# Patient Record
Sex: Female | Born: 1950 | Race: White | Hispanic: No | Marital: Single | Smoking: Never smoker
Health system: Southern US, Community
[De-identification: ages and names within clinical notes are randomized; demographics above are authoritative.]

## PROBLEM LIST (undated history)

## (undated) DIAGNOSIS — E119 Type 2 diabetes mellitus without complications: Secondary | ICD-10-CM

## (undated) DIAGNOSIS — I10 Essential (primary) hypertension: Secondary | ICD-10-CM

## (undated) DIAGNOSIS — E039 Hypothyroidism, unspecified: Secondary | ICD-10-CM

## (undated) DIAGNOSIS — K219 Gastro-esophageal reflux disease without esophagitis: Secondary | ICD-10-CM

## (undated) HISTORY — DX: Gastro-esophageal reflux disease without esophagitis: K21.9

## (undated) HISTORY — PX: MASTECTOMY: SHX3

## (undated) HISTORY — DX: Type 2 diabetes mellitus without complications: E11.9

## (undated) HISTORY — DX: Essential (primary) hypertension: I10

## (undated) HISTORY — DX: Hypothyroidism, unspecified: E03.9

---

## 1993-03-06 HISTORY — PX: TOTAL ABDOMINAL HYSTERECTOMY: SHX209

## 1997-11-24 ENCOUNTER — Encounter: Admission: RE | Admit: 1997-11-24 | Discharge: 1998-02-22 | Payer: Self-pay | Admitting: Internal Medicine

## 1998-04-19 ENCOUNTER — Other Ambulatory Visit: Admission: RE | Admit: 1998-04-19 | Discharge: 1998-04-19 | Payer: Self-pay | Admitting: Obstetrics and Gynecology

## 1998-10-25 ENCOUNTER — Other Ambulatory Visit: Admission: RE | Admit: 1998-10-25 | Discharge: 1998-10-25 | Payer: Self-pay | Admitting: Obstetrics and Gynecology

## 1999-05-11 ENCOUNTER — Other Ambulatory Visit: Admission: RE | Admit: 1999-05-11 | Discharge: 1999-05-11 | Payer: Self-pay | Admitting: Obstetrics and Gynecology

## 2000-05-31 ENCOUNTER — Other Ambulatory Visit: Admission: RE | Admit: 2000-05-31 | Discharge: 2000-05-31 | Payer: Self-pay | Admitting: Obstetrics and Gynecology

## 2001-06-20 ENCOUNTER — Other Ambulatory Visit: Admission: RE | Admit: 2001-06-20 | Discharge: 2001-06-20 | Payer: Self-pay | Admitting: Obstetrics and Gynecology

## 2002-06-25 ENCOUNTER — Other Ambulatory Visit: Admission: RE | Admit: 2002-06-25 | Discharge: 2002-06-25 | Payer: Self-pay | Admitting: Obstetrics and Gynecology

## 2003-01-01 ENCOUNTER — Ambulatory Visit (HOSPITAL_BASED_OUTPATIENT_CLINIC_OR_DEPARTMENT_OTHER): Admission: RE | Admit: 2003-01-01 | Discharge: 2003-01-01 | Payer: Self-pay | Admitting: Orthopedic Surgery

## 2003-01-01 ENCOUNTER — Ambulatory Visit (HOSPITAL_COMMUNITY): Admission: RE | Admit: 2003-01-01 | Discharge: 2003-01-01 | Payer: Self-pay | Admitting: Orthopedic Surgery

## 2003-01-01 ENCOUNTER — Encounter (INDEPENDENT_AMBULATORY_CARE_PROVIDER_SITE_OTHER): Payer: Self-pay | Admitting: *Deleted

## 2003-06-26 ENCOUNTER — Other Ambulatory Visit: Admission: RE | Admit: 2003-06-26 | Discharge: 2003-06-26 | Payer: Self-pay | Admitting: Obstetrics and Gynecology

## 2003-12-30 ENCOUNTER — Ambulatory Visit (HOSPITAL_COMMUNITY): Admission: RE | Admit: 2003-12-30 | Discharge: 2003-12-30 | Payer: Self-pay | Admitting: Gastroenterology

## 2006-05-03 ENCOUNTER — Encounter: Admission: RE | Admit: 2006-05-03 | Discharge: 2006-05-03 | Payer: Self-pay | Admitting: Obstetrics and Gynecology

## 2006-05-03 ENCOUNTER — Encounter (INDEPENDENT_AMBULATORY_CARE_PROVIDER_SITE_OTHER): Payer: Self-pay | Admitting: Diagnostic Radiology

## 2006-05-03 ENCOUNTER — Encounter (INDEPENDENT_AMBULATORY_CARE_PROVIDER_SITE_OTHER): Payer: Self-pay | Admitting: *Deleted

## 2006-05-11 ENCOUNTER — Encounter: Admission: RE | Admit: 2006-05-11 | Discharge: 2006-05-11 | Payer: Self-pay | Admitting: Obstetrics and Gynecology

## 2006-05-28 ENCOUNTER — Encounter: Admission: RE | Admit: 2006-05-28 | Discharge: 2006-05-28 | Payer: Self-pay | Admitting: General Surgery

## 2006-05-28 ENCOUNTER — Ambulatory Visit (HOSPITAL_COMMUNITY): Admission: RE | Admit: 2006-05-28 | Discharge: 2006-05-28 | Payer: Self-pay | Admitting: General Surgery

## 2006-05-29 ENCOUNTER — Encounter: Admission: RE | Admit: 2006-05-29 | Discharge: 2006-05-29 | Payer: Self-pay | Admitting: General Surgery

## 2006-05-29 ENCOUNTER — Ambulatory Visit (HOSPITAL_BASED_OUTPATIENT_CLINIC_OR_DEPARTMENT_OTHER): Admission: RE | Admit: 2006-05-29 | Discharge: 2006-05-29 | Payer: Self-pay | Admitting: General Surgery

## 2006-05-29 ENCOUNTER — Encounter (INDEPENDENT_AMBULATORY_CARE_PROVIDER_SITE_OTHER): Payer: Self-pay | Admitting: Specialist

## 2006-06-04 ENCOUNTER — Ambulatory Visit: Payer: Self-pay | Admitting: Oncology

## 2006-06-14 ENCOUNTER — Ambulatory Visit (HOSPITAL_COMMUNITY): Admission: RE | Admit: 2006-06-14 | Discharge: 2006-06-14 | Payer: Self-pay | Admitting: General Surgery

## 2006-06-14 ENCOUNTER — Encounter (INDEPENDENT_AMBULATORY_CARE_PROVIDER_SITE_OTHER): Payer: Self-pay | Admitting: Specialist

## 2006-06-18 ENCOUNTER — Ambulatory Visit: Admission: RE | Admit: 2006-06-18 | Discharge: 2006-09-16 | Payer: Self-pay | Admitting: *Deleted

## 2006-07-05 ENCOUNTER — Encounter (INDEPENDENT_AMBULATORY_CARE_PROVIDER_SITE_OTHER): Payer: Self-pay | Admitting: Specialist

## 2006-07-06 ENCOUNTER — Inpatient Hospital Stay (HOSPITAL_COMMUNITY): Admission: RE | Admit: 2006-07-06 | Discharge: 2006-07-07 | Payer: Self-pay | Admitting: General Surgery

## 2006-07-17 LAB — CBC WITH DIFFERENTIAL/PLATELET
Basophils Absolute: 0.2 10*3/uL — ABNORMAL HIGH (ref 0.0–0.1)
MCH: 30.2 pg (ref 26.0–34.0)
MCV: 84.3 fL (ref 81.0–101.0)
MONO#: 0.7 10*3/uL (ref 0.1–0.9)
MONO%: 6.6 % (ref 0.0–13.0)
NEUT#: 8.5 10*3/uL — ABNORMAL HIGH (ref 1.5–6.5)
NEUT%: 74.8 % (ref 39.6–76.8)
Platelets: 217 10*3/uL (ref 145–400)
RBC: 4.38 10*6/uL (ref 3.70–5.32)
RDW: 12.3 % (ref 11.3–14.5)
WBC: 11.3 10*3/uL — ABNORMAL HIGH (ref 3.9–10.0)
lymph#: 1.8 10*3/uL (ref 0.9–3.3)

## 2006-07-17 LAB — COMPREHENSIVE METABOLIC PANEL
Alkaline Phosphatase: 77 U/L (ref 39–117)
BUN: 12 mg/dL (ref 6–23)
Calcium: 9.1 mg/dL (ref 8.4–10.5)
Glucose, Bld: 203 mg/dL — ABNORMAL HIGH (ref 70–99)

## 2007-08-15 IMAGING — CR DG CHEST 2V
2 series · 2 of 2 positions shown · non-contrast
Comparison: None.

CLINICAL DATA: Pre op left breast surgery.
 CHEST ? 2 VIEW:

[w chest pa]
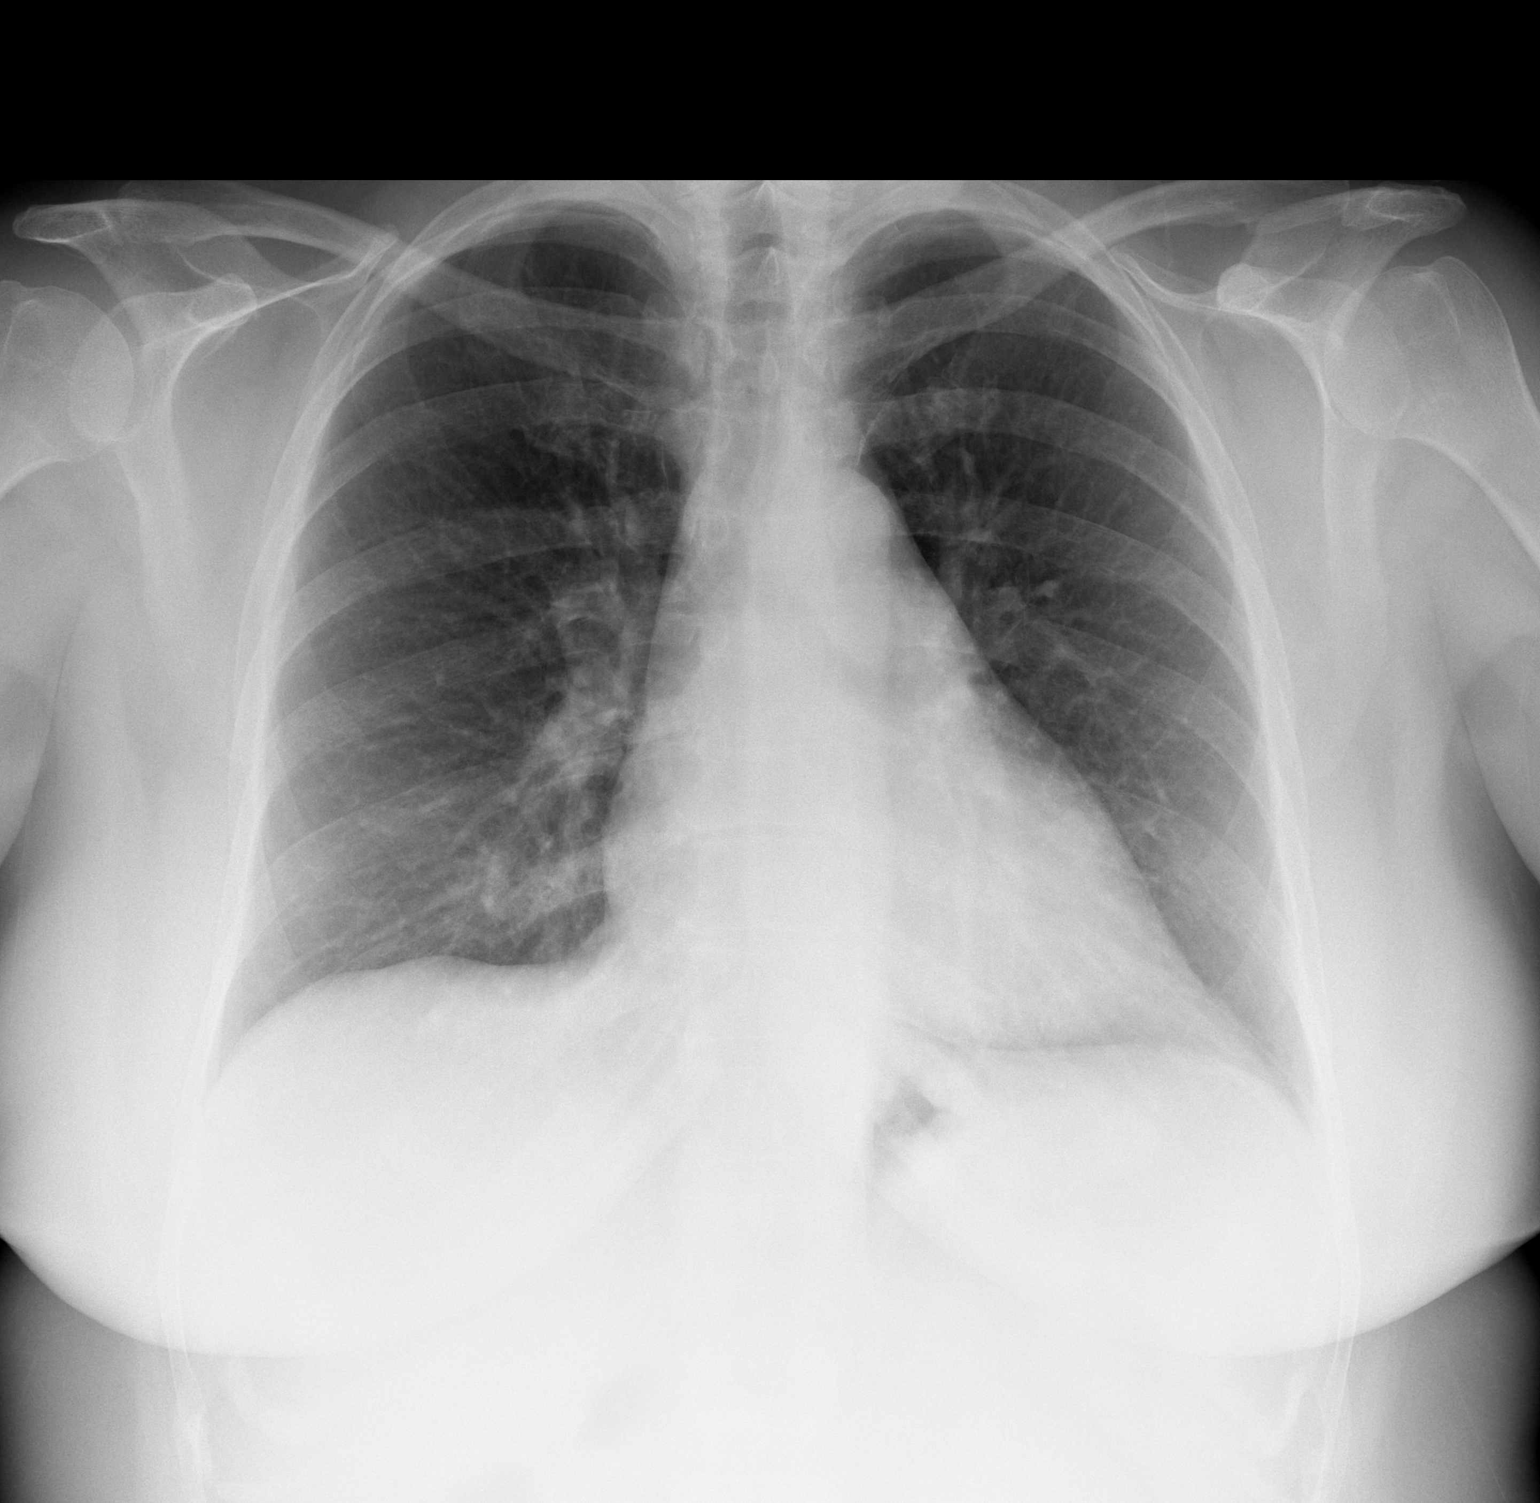

[w chest lat]
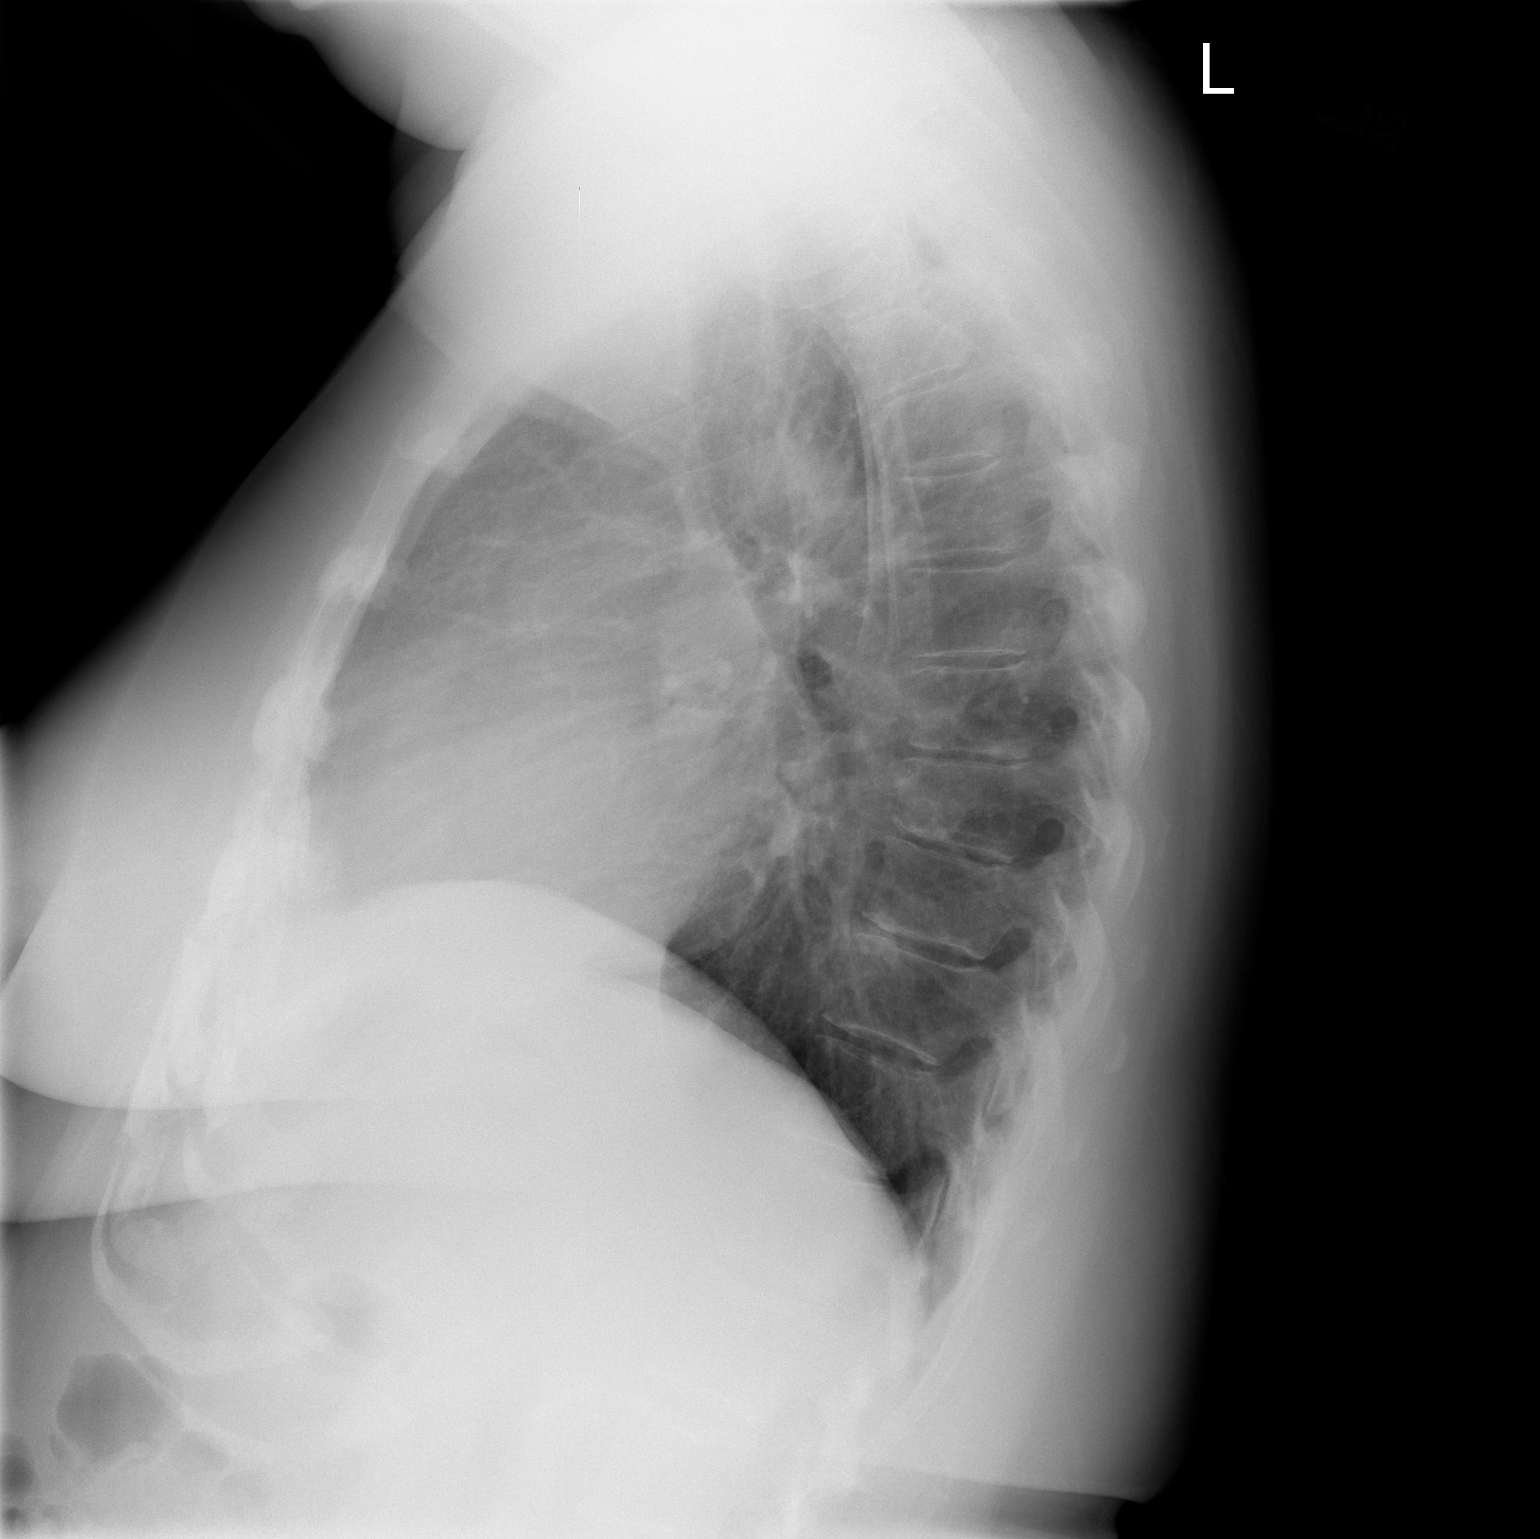

[2 of 2 positions shown; findings below may reference images not displayed]

FINDINGS: Trachea is midline.  Heart size is within normal limits.  Lungs are clear.  No pleural fluid.
IMPRESSION: No acute findings.

## 2008-08-26 ENCOUNTER — Emergency Department (HOSPITAL_COMMUNITY): Admission: EM | Admit: 2008-08-26 | Discharge: 2008-08-26 | Payer: Self-pay | Admitting: Emergency Medicine

## 2009-02-12 ENCOUNTER — Ambulatory Visit (HOSPITAL_BASED_OUTPATIENT_CLINIC_OR_DEPARTMENT_OTHER): Admission: RE | Admit: 2009-02-12 | Discharge: 2009-02-12 | Payer: Self-pay | Admitting: Orthopedic Surgery

## 2010-03-27 ENCOUNTER — Encounter: Payer: Self-pay | Admitting: General Surgery

## 2010-06-07 LAB — GLUCOSE, CAPILLARY
Glucose-Capillary: 246 mg/dL — ABNORMAL HIGH (ref 70–99)
Glucose-Capillary: 275 mg/dL — ABNORMAL HIGH (ref 70–99)

## 2010-06-07 LAB — BASIC METABOLIC PANEL
Creatinine, Ser: 0.79 mg/dL (ref 0.4–1.2)
GFR calc non Af Amer: >60 mL/min (ref >60)
Glucose, Bld: 274 mg/dL — ABNORMAL HIGH (ref 70–99)

## 2010-07-22 NOTE — Op Note (Signed)
NAME:  Colleen West, Colleen West                      ACCOUNT NO.:  [id number]   MEDICAL RECORD NO.:  [id number]                   PATIENT TYPE:  AMB   LOCATION:  DSC                                  FACILITY:  [facility]   PHYSICIAN:  Katy Fitch. Naaman Plummer., M.D.          DATE OF BIRTH:  1950-11-09   DATE OF PROCEDURE:  01/01/2003  DATE OF DISCHARGE:                                 OPERATIVE REPORT   PREOPERATIVE DIAGNOSES:  1. Bilobed mass, left index finger, with one mass presenting on the dorsal     aspect of the finger distal to the distal interphalangeal joint adjacent     to the nail fold measuring approximately 8 mm in diameter.  2. A second mass measuring approximately 10 x 7 mm on the radial aspect of     the pulp at the distal interphalangeal flexion crease near the     trifurcation of the radial proper digital nerve.   POSTOPERATIVE DIAGNOSES:  1. Bilobed mass, left index finger, with one mass presenting on the dorsal     aspect of the finger distal to the distal interphalangeal joint adjacent     to the nail fold measuring approximately 8 mm in diameter.  2. A second mass measuring approximately 10 x 7 mm on the radial aspect of     the pulp at the distal interphalangeal flexion crease near the     trifurcation of the radial proper digital nerve.   OPERATIONS:  1. 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  2. 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   OPERATING SURGEON:  Katy Fitch. Sypher, M.D.   ASSISTANT:  Jonni Sanger, P.A.   ANESTHESIA:  General by LMA.   SUPERVISING ANESTHESIOLOGIST:  Kaylyn Layer. Michelle Piper, M.D.   TOURNIQUET:  230 mmHg.   INDICATIONS:  Colleen West is a 60 year old woman referred by Dr. Felipa Eth  for evaluation and management of a mass involving her left index finger.  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   We recommended an excisional biopsy for diagnosis and hopeful resolution.  She was advised preoperatively that giant cell tumors and granuloma annulare  are commonly recurrent due to an inability to obtain a margin.   It is entirely possibly that the DIP joint capsule is involved in the  patholophysiologic process leading to development of this bilobed mass.   DESCRIPTION OF PROCEDURE:  Colleen West was brought to the operating  room and placed in the supine position on the operating table.  Following  induction of general anesthesia by LMA, the left arm was prepped with  Betadine soap and solution and sterilely draped.  A pneumatic tourniquet  was  applied to the proximal brachium.   Following exsanguination of the limb with the esmarch bandage, an arterial  tourniquet on the proximal brachium was inflate to 230 mmHg.   The procedure commenced with a curvilinear incision directly over the dorsal  mass.  The subcutaneous tissues were carefully divided, revealing a yellow-  white firm mass consistent with a possible granuloma annulare or perhaps a  variant of a giant cell tumor.  This was circumferentially dissected down to  the capsule of the DIP joint.  This was resected with a rongeur and a small  arthrotomy allowed synovectomy of the DIP joint.   The mass was passed off into formalin.  The wound was repaired with an  interrupted sutures of 5-0 nylon.   Attention was then directed to the pulp of the left index finger.  A  curvilinear incision was fashioned with Brunner zigzag style around the DIP  flexion crease.  The radial nerve vascular bundle was isolated.  A Freer  elevator was used to bluntly dissect around the mass.  This involved the  radial collateral region of the DIP joint and volar plate insertion.  This  was carefully circumferentially dissected and excised.   The wounds were  repaired with an interrupted suture of 5-0 nylon.   A compressive dressing was applied with Xeroform sterile gauze and Coban.   The final diagnosis is pending the results of the histopathologic evaluation  of this mass.  We have advised Ms. Vice to elevate her hand x 5 days and  work on range of motion exercises to the adjacent fingers.  She will return  to our office for followup in a week for suture removal and discussion of  her pathologic findings.                                               Katy Fitch Naaman Plummer., M.D.    RVS/MEDQ  D:  01/01/2003  T:  01/01/2003  Job:  [id number]   cc:   Larina Earthly, M.D.  [address]  [place]  [place] [address]  Fax: [phone]

## 2010-07-22 NOTE — Op Note (Signed)
NAME:  Colleen West, Colleen West            ACCOUNT NO.:  [id number]   MEDICAL RECORD NO.:  [id number]          PATIENT TYPE:  AMB   LOCATION:  DAY                          [facility]ACILITY:  [facility]   PHYSICIAN:  Timothy E. Earlene Plater, M.D. DATE OF BIRTH:  1950/05/14   DATE OF PROCEDURE:  DATE OF DISCHARGE:                               OPERATIVE REPORT   PREOPERATIVE DIAGNOSIS:  Ductal carcinoma in situ, left breast, positive  margins.   POSTOPERATIVE DIAGNOSIS:  Ductal carcinoma in situ, left breast,  positive margins.   PROCEDURE:  Excision of margins for DCIS.   SURGEON:  Timothy E. Earlene Plater, M.D.   ANESTHESIA:  Local standby.   INDICATION:  The patient is 13, has known DCIS, left breast, and  underwent a partial mastectomy approximately 3 weeks ago and though the  specimen is quite large, the so-called anterior margin was positive for  DCIS.  So, she is back for reexcision of the margins.  Seen and  identified the left breast mark.   PROCEDURE IN DETAIL:  The patient was taken to the operating room and IV  sedation was given.  The left breast was prepped and draped in the usual  fashion.  0.25% Marcaine with epinephrine was used along the wound,  which was opened.  Some seroma aspirated.  The wound was explored.  No  obvious abnormal tissue was seen.  The only fibrous tissue at all ones  inferior, i.e., the lower outer quadrant of that breast.  So, I made a  generous biopsy of that area, called inferior, and then I made a north  anterior and south anterior biopsy off the edges of the open wound.  Bleeding was controlled, and the wound was irrigated and closed and  layers with Vicryl and Monocryl and Steri-Strips.  Counts were correct.  She tolerated it well and was removed to the recovery room.      Timothy E. Earlene Plater, M.D.  Electronically Signed     TED/MEDQ  D:  06/14/2006  T:  06/14/2006  Job:  [id number]   cc:   [facility] [place]

## 2010-07-22 NOTE — Op Note (Signed)
NAME:  Colleen West, Colleen West            ACCOUNT NO.:  [id number]   MEDICAL RECORD NO.:  [id number]          PATIENT TYPE:  AMB   LOCATION:  DAY                          FACILITY:  [facility]   PHYSICIAN:  Timothy E. Earlene Plater, M.D. 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   PROCEDURE:  Bilateral mastectomy.   SURGEON:  Timothy E. Earlene Plater, M.D.   ASSISTANT:  Clovis Pu. Cornett, M.D.   ANESTHESIA:  General.   INDICATIONS FOR PROCEDURE:  Colleen West is a healthy 60 year old  Caucasian female with abnormal mammograms, core biopsy showing DCIS,  partial mastectomy showing DCIS with positive margins, repeat excision  showing positive margins.  Following these two fairly extensive partial  mastectomies, her only recourse is to a total mastectomy to which she  agrees.  After considerable following and consideration she also wishes  to have the opposite breast removed to reduce her rest.  Her risk  factors of course are that she already had DCIS, she is obese, there is  no family history.  The patient was carefully counseled and we have  agreed to proceed with total mastectomies bilateral.  She is not wished  for reconstruction at this time.  She has had oncologic and radiation  therapy consultations.  The patient was seen, identified and the skin  lines in breast marked preop.  She was evaluated by Anesthesia.   DESCRIPTION OF PROCEDURE:  She was taken to the operating room, placed  supine.  General endotracheal anesthesia was administered.  She was  carefully placed in the center of the table arms outstretched  symmetrically again the breast and skin were marked.  Then the entire  chest was prepped and draped in the usual fashion.  I approached  the  normal right breast first and carefully marked.  An ellipse was made  including the nipple-areolar complex and flaps were raised first  superior then inferior down to the pectoralis muscle and as well  laterally to the pectoralis muscle.  The breast was then removed from  medial to lateral leaving the pectoralis fascia and the breast was  excised lateral to the pectoralis muscle laterally.  The axilla of  course was not violated purposefully.  That wound was examined the  breast was passed off the table and labeled.  Bleeding was controlled  with cautery and a 19 mm Blake drain was placed laterally.  That wound  was covered.  Gloves and instruments were changed.  We then approached  the diseased left breast in the same fashion after marking the skin an  ellipse was made including the nipple-areolar complex and the previous  incision which was a horizontal incision laterally.  The ellipse was  carried down to the junction of the deep and subcutaneous fatty tissue  and the entire breast removed in the same fashion with flaps inferior,  superior medial to  lateral excision of the breast part of the pectoralis  fascia was removed and the breast and fatty tissue was removed out to  the lateral margin of the previous surgery.  This did not include the  axilla.  That breast was passed off marked as diseased breast.  The  wound was carefully examined irrigated.  Bleeding was  controlled, a drain was placed.  Then both incisions were closed wide  skin staples and suction applied drains.  All counts correct.  She  tolerated it well.  Blood loss was minimal and she was removed to  recovery room where she will be admitted for observation and followed  then later as an outpatient.      Timothy E. Earlene Plater, M.D.  Electronically Signed     TED/MEDQ  D:  07/05/2006  T:  07/05/2006  Job:  [id number]   cc:   [facility]  [facility], M.D.  Fax:  [phone]   Larina Earthly, M.D.  Fax: [phone]

## 2010-07-22 NOTE — Op Note (Signed)
NAME:  Colleen West, Colleen West            ACCOUNT NO.:  [id number]   MEDICAL RECORD NO.:  [id number]          PATIENT TYPE:  AMB   LOCATION:  ENDO                         FACILITY:  [facility]   PHYSICIAN:  Anselmo Rod, M.D.  DATE OF BIRTH:  August 29, 1950   DATE OF PROCEDURE:  12/30/2003  DATE OF DISCHARGE:                                 OPERATIVE REPORT   PROCEDURE PERFORMED:  Screening colonoscopy.   ENDOSCOPIST:  Charna Elizabeth, M.D.   INSTRUMENT USED:  Olympus video colonoscope.   INDICATIONS FOR PROCEDURE:  The patient is a 60 year old white female with a  family history of colon polyps in her mother.  Undergoing screening  colonoscopy to rule out colonic polyps, masses, etc.   PREPROCEDURE PREPARATION:  Informed consent was procured from the patient.  The patient was fasted for eight hours prior to the procedure and prepped  with a bottle of magnesium citrate and a gallon of GoLYTELY the night prior  to the procedure.   PREPROCEDURE PHYSICAL:  The patient had stable vital signs.  Neck supple.  Chest clear to auscultation.  S1 and S2 regular.  Abdomen soft with normal  bowel sounds.   DESCRIPTION OF PROCEDURE:  The patient was placed in left lateral decubitus  position and sedated with 80 mg of Demerol and 8 mg of Versed in slow  incremental doses.  Once the patient was adequately sedated and maintained  on low flow oxygen and continuous cardiac monitoring, the Olympus video  colonoscope was advanced from the rectum to the cecum.  The appendicular  orifice and ileocecal valve were clearly visualized and photographed.  No  masses, polyps, erosions, ulcerations or diverticula were seen.  Small  internal hemorrhoids were appreciated on retroflexion in the rectum.  The  patient tolerated the procedure well without immediate complications.   IMPRESSION:  1.  Normal colonoscopy up to the cecum except for small internal      hemorrhoids.  2.  No masses, polyps, or diverticula were  seen.   RECOMMENDATIONS:  1.  Continue high fiber diet with liberal fluid intake.  2.  Repeat colonoscopy is recommended in the next 10 years unless the      patient develops any abnormal symptoms in the interim.  3.  Outpatient followup as need arises in the future.       JNM/MEDQ  D:  12/30/2003  T:  12/30/2003  Job:  [id number]   cc:   Maxie Better, M.D.  [address]  [place]  [place] [address]  Fax: [phone]   Larina Earthly, M.D.  [address]  [place]  [place] [address]  Fax: [phone]

## 2010-07-22 NOTE — Op Note (Signed)
NAME:  [name], Colleen West            ACCOUNT NO.:  [id number]   MEDICAL RECORD NO.:  [id number]          PATIENT TYPE:  AMB   LOCATION:  DSC                          FACILITY:  [facility]   PHYSICIAN:  Timothy E. Earlene Plater, M.D. DATE OF BIRTH:  [birth year]   DATE OF PROCEDURE:  05/29/2006  DATE OF DISCHARGE:                               OPERATIVE REPORT   PREOPERATIVE DIAGNOSIS:  Ductal carcinoma in situ left breast.   POSTOPERATIVE DIAGNOSIS:  Ductal carcinoma in situ left breast.   PROCEDURE:  Four-bracket left partial mastectomy.   SURGEON:  Timothy E. Earlene Plater, M.D.   ANESTHESIA:  General.   Ms. Dusseau had on routine mammography an abnormality, multiple  calcifications.  Core biopsy done showing DCIS.  She has been carefully  counseled.  Further studies including MRI were negative.  She is  prepared today for a needle-localized excision of abnormal tissue.  She  is seen, identified, the permit signed and the left breast marked.   She is taken to the operating room, placed supine.  LMA anesthesia  provided and the left arm outstretched.  The bandage removed and the  four wires bracketed the calcifications, placed there by the radiologist  at [facility].  I carefully coordinated the patient, her breast and the four  needles with the mammograms and designed a horizontal incision through  the square of the four wires.  The area was prepped and draped in the  usual fashion.  Marcaine 0.25% with epinephrine was used along the line  of incision.  A generous incision made between the two horizontal wires  and in the junction of the superficial and deep fatty tissue a plane was  developed to encompass all four wires generously.  Then I began to cut  through the tissue down to the chest wall at the outer bounds of the  wires.  The only area of suspicion was medially under the nipple, and so  I extended the dissection to include the more firm and gritty tissue.  This was a very large specimen.  It was  carefully marked and sent to  radiology.  Meanwhile, the wound was cleaned up, irrigated and cautery  was used where necessary.  I then approximated the wound with Vicryl and  Monocryl and Steri-Strips.  Final counts were correct.   The radiologist did called saying that apparently the margins were all  inclusive within the wire and the specimen.  So the procedure was  complete.  Instructions were given to the patient.  She will be seen and  followed the office carefully.      Timothy E. Earlene Plater, M.D.  Electronically Signed     TED/MEDQ  D:  05/29/2006  T:  05/29/2006  Job:  [id number]   cc:   Larina Earthly, M.D.  Maxie Better, M.D.  [facility]

## 2012-11-08 ENCOUNTER — Encounter (INDEPENDENT_AMBULATORY_CARE_PROVIDER_SITE_OTHER): Payer: Self-pay | Admitting: Surgery

## 2012-11-08 ENCOUNTER — Ambulatory Visit (INDEPENDENT_AMBULATORY_CARE_PROVIDER_SITE_OTHER): Payer: BC Managed Care – PPO | Admitting: Surgery

## 2012-11-08 VITALS — BP 126/80 | HR 70 | Temp 98.0°F | Resp 18 | Ht 66.0 in | Wt 190.0 lb

## 2012-11-08 DIAGNOSIS — D0590 Unspecified type of carcinoma in situ of unspecified breast: Secondary | ICD-10-CM

## 2012-11-08 DIAGNOSIS — D0510 Intraductal carcinoma in situ of unspecified breast: Secondary | ICD-10-CM | POA: Insufficient documentation

## 2012-11-08 DIAGNOSIS — D0512 Intraductal carcinoma in situ of left breast: Secondary | ICD-10-CM

## 2012-11-08 NOTE — Progress Notes (Signed)
The patient is status post bilateral mastectomies for extensive DCIS of the left breast. The surgery was performed in 2008 by Dr. Lorelee New. The patient has been doing well. She has noticed some tightness when she tries to raise her left arm. This tightness is located in her axilla. She cannot really palpate any masses in this area.  Her mastectomy incisions are both well healed with no sign of any palpable masses or nodules. I cannot palpate any lymphadenopathy on either side. She does have a bit of excess subcutaneous adipose tissue in the left axilla but this is unchanged from previous examinations. She does have some tightness in her left axilla when she raises her arm over her head.  We will obtain an ultrasound of her left axilla to rule out any type of lymphadenopathy or other reasons for her axillary symptoms. If the ultrasound is negative we will refer her to physical therapy for exercises to try to stretch out her axilla. Recheck after ultrasound.  Wilmon Arms. Corliss Skains, MD, [facility]  General/ Trauma Surgery  11/08/2012 12:25 PM

## 2012-11-11 ENCOUNTER — Ambulatory Visit
Admission: RE | Admit: 2012-11-11 | Discharge: 2012-11-11 | Disposition: A | Discharge status: Home / Self Care | Payer: BC Managed Care – PPO | Source: Ambulatory Visit | Attending: Surgery | Admitting: Surgery

## 2012-11-11 DIAGNOSIS — D0512 Intraductal carcinoma in situ of left breast: Secondary | ICD-10-CM

## 2012-11-15 ENCOUNTER — Ambulatory Visit (INDEPENDENT_AMBULATORY_CARE_PROVIDER_SITE_OTHER): Payer: BC Managed Care – PPO | Admitting: Surgery

## 2012-11-15 ENCOUNTER — Encounter (INDEPENDENT_AMBULATORY_CARE_PROVIDER_SITE_OTHER): Payer: Self-pay | Admitting: Surgery

## 2012-11-15 ENCOUNTER — Other Ambulatory Visit (INDEPENDENT_AMBULATORY_CARE_PROVIDER_SITE_OTHER): Payer: Self-pay | Admitting: Surgery

## 2012-11-15 VITALS — BP 128/78 | HR 84 | Temp 96.7°F | Resp 16 | Ht 64.0 in | Wt 191.2 lb

## 2012-11-15 DIAGNOSIS — D0590 Unspecified type of carcinoma in situ of unspecified breast: Secondary | ICD-10-CM

## 2012-11-15 DIAGNOSIS — Z9889 Other specified postprocedural states: Secondary | ICD-10-CM

## 2012-11-15 DIAGNOSIS — D0512 Intraductal carcinoma in situ of left breast: Secondary | ICD-10-CM

## 2012-11-15 NOTE — Patient Instructions (Addendum)
Referral to Physical Therapy for stretching exercises to left axilla secondary to scar tissue Referral to Plastics - Dr. Kelly Splinter - possible bilateral breast reconstruction.

## 2012-11-15 NOTE — Progress Notes (Signed)
The patient returns for followup of her left axillary tightness. The ultrasound was negative. There are no enlarged lymph nodes or abnormal nodules.  Clinical Data: The patient underwent left mastectomy for breast  cancer in 2008. She had a prophylactic right mastectomy. She  states that she has felt tightness in the left axilla since the  surgery. This has increased since beginning an exercise program  recently.  LEFT AXILLARY ULTRASOUND  Comparison: None.  On physical exam, no mass is palpated in the left axilla.  Findings: Ultrasound is performed, showing normal axillary contents  with normal axillary lymph nodes.  IMPRESSION:  No sonographic evidence of malignancy.  RECOMMENDATION:  Clinical follow-up is recommended. No mammographic follow-up  needed.  I have discussed the findings and recommendations with the patient.  Results were also provided in writing at the conclusion of the  visit.  BI-RADS CATEGORY 1: Negative.  Original Report Authenticated By: Cain Saupe, M.D.    We will refer the patient to physical therapy for stretching exercises. She may also massaged this area. Patient is interested in discussion with the plastic surgeon regarding possible breast reconstruction. We will refer her to Dr. Wayland Denis for consultation.   Wilmon Arms. Corliss Skains, MD, [facility]  General/ Trauma Surgery  11/15/2012 9:36 AM

## 2012-12-03 ENCOUNTER — Encounter (INDEPENDENT_AMBULATORY_CARE_PROVIDER_SITE_OTHER): Payer: Self-pay

## 2017-05-16 ENCOUNTER — Institutional Professional Consult (permissible substitution): Payer: Self-pay | Admitting: Neurology

## 2017-07-20 ENCOUNTER — Encounter: Payer: Self-pay | Admitting: Neurology

## 2017-07-23 ENCOUNTER — Ambulatory Visit (INDEPENDENT_AMBULATORY_CARE_PROVIDER_SITE_OTHER): Payer: Medicare HMO | Admitting: Neurology

## 2017-07-23 ENCOUNTER — Encounter: Payer: Self-pay | Admitting: Neurology

## 2017-07-23 VITALS — BP 140/67 | HR 77 | Ht 64.0 in | Wt 191.0 lb

## 2017-07-23 DIAGNOSIS — R51 Headache: Secondary | ICD-10-CM | POA: Diagnosis not present

## 2017-07-23 DIAGNOSIS — R519 Headache, unspecified: Secondary | ICD-10-CM | POA: Insufficient documentation

## 2017-07-23 DIAGNOSIS — R0683 Snoring: Secondary | ICD-10-CM | POA: Diagnosis not present

## 2017-07-23 NOTE — Progress Notes (Signed)
SLEEP MEDICINE CLINIC   Provider:  Melvyn Novas, M D  Primary Care Physician:  Chilton Greathouse, MD   Referring Provider: Chilton Greathouse, MD    Chief Complaint  Patient presents with  . New Patient (Initial Visit)    pt with husband, rm 67. pt husband states she snores in sleep.. he states during the first part of the night she is fine but around 3/4 am she starts snoring and heavy breathing in sleep.     HPI:  Colleen West is a 67 y.o. female  patient, seen here as in a referral   from Dr. Felipa Eth for sn evaluation of snoring, sleep apnea.  Chief complaint according to patient : " I wake with a dry moth and with a dull headache, 80% of all mornings."    I have the pleasure of meeting Colleen West and her husband  on the  07-23-2017 , she is a 67 year old caucasian female patient of Dr. Felipa Eth, whom I see today to evaluate for sleep apnea in the setting of loud snoring.  She just remarried 6 month ago and the couple lives I together for almost 5 years, but he is very concerned and bothered by her snoring.  The patient lived by herself for about 2 decades and even in that time remembers waking up by her own snoring.  Her medical health history also aside from snoring and weight gain positive for diabetes mellitus, patient hemoglobin A1c of 7.9 as of January of this year, hypertension which is described as benign and controlled on 2 drugs, some anxiety reaction to stress, hyperlipidemia on simvastatin, acid reflux, hypothyroidism but in addition she was diagnosed with breast cancer in 2008 and underwent bilateral mastectomies. She is known to snore within 10 minutes into a nap,and at night starts snoring at 2-3 AM. She wakes up with crazy dreams, with dull headaches. One nocturia.   Sleep habits are as follows: The patient usually spends time in her sewing room or on her smart phone for the last hour of the day before she retreats to the bedroom.  The bedroom is cool, quiet and dark  and there is no TV installed. The bedtime is around 10.30 PM.  The patient sometimes still has hot flashes but her husband suffers from arthritis and is temperature sensitive, too.  She is almost instantly asleep, she prefers to fall asleep on her side but uses 3 pillows to prop up for GERD. She may have one bathroom break at 3 AM and feels she gets the deepest and most sound sleep in the morning hours, when she also snores.  She is awake at 7.30 but often stays in bed until 9 AM or later. No trouble to get 8 or more hours of sleep. She still likes more sleep and often naps- 2 hours more- .    Sleep medical history and family sleep history:   Father was a loud snorer, had with DM, HTN, CHF died at 68 -snoring with possible sleep apnea as were many paternal family members. Mother passed at 69 , snored.   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  Non smoker,  2 glasses of wine weekly, sweet tea 2-4 a day. 1 coffee, no sodas, no energy drinks.    Review of Systems: Out of a complete 14 system review, the patient complains of only the following symptoms, and all other reviewed systems are negative.  Hearing loss, snoring , obesity,  Epworth score 7/ 24  , Fatigue severity score 39 , geriatric depression score 1/15    Social History   Socioeconomic History  . Marital status: Single    Spouse name: Not on file  . Number of children: Not on file  . Years of education: Not on file  . Highest education level: Not on file  Occupational History  . Not on file  Social Needs  . Financial resource strain: Not on file  . Food insecurity:    Worry: Not on file    Inability: Not on file  . Transportation needs:    Medical: Not on file    Non-medical: Not on file  Tobacco Use  . Smoking status: Never Smoker  . Smokeless tobacco: Never Used  Substance and Sexual Activity  . Alcohol use: Yes    Comment: occaional   . Drug use: No  . Sexual  activity: Not on file  Lifestyle  . Physical activity:    Days per week: Not on file    Minutes per session: Not on file  . Stress: Not on file  Relationships  . Social connections:    Talks on phone: Not on file    Gets together: Not on file    Attends religious service: Not on file    Active member of club or organization: Not on file    Attends meetings of clubs or organizations: Not on file    Relationship status: Not on file  . Intimate partner violence:    Fear of current or ex partner: Not on file    Emotionally abused: Not on file    Physically abused: Not on file    Forced sexual activity: Not on file  Other Topics Concern  . Not on file  Social History Narrative  . Not on file    Family History  Problem Relation Age of Onset  . Thyroid disease Mother   . Osteoporosis Mother   . Hypertension Father   . CAD Father   . Diabetes Father   . Heart failure Father   . Hypertension Brother   . Sleep apnea Brother     Past Medical History:  Diagnosis Date  . Diabetes mellitus without complication (HCC)    type 2  . GERD (gastroesophageal reflux disease)   . Hypertension   . Hypothyroidism     Past Surgical History:  Procedure Laterality Date  . MASTECTOMY Bilateral   . TOTAL ABDOMINAL HYSTERECTOMY  1995    Current Outpatient Medications  Medication Sig Dispense Refill  . amLODipine (NORVASC) 5 MG tablet Take 5 mg by mouth daily.    Marland Kitchen buPROPion (WELLBUTRIN XL) 150 MG 24 hr tablet Take 150 mg by mouth daily.    . Canagliflozin (INVOKANA) 300 MG TABS Take by mouth.    . irbesartan (AVAPRO) 300 MG tablet Take 300 mg by mouth daily.    Marland Kitchen levothyroxine (SYNTHROID, LEVOTHROID) 112 MCG tablet Take 112 mcg by mouth daily before breakfast.    . linagliptin (TRADJENTA) 5 MG TABS tablet Take 5 mg by mouth daily.    . metFORMIN (GLUCOPHAGE) 500 MG tablet Take 500 mg by mouth 2 (two) times daily with a meal.    . olmesartan (BENICAR) 20 MG tablet Take 20 mg by mouth  daily.    . simvastatin (ZOCOR) 20 MG tablet Take 20 mg by mouth every evening.     No current facility-administered medications for this visit.     Allergies as of  07/23/2017 - Review Complete 07/23/2017  Allergen Reaction Noted  . Sulfur Cough 11/08/2012    Vitals: BP 140/67   Pulse 77   Ht  (1.626 m)   Wt 191 lb (86.6 kg)   BMI 32.79 kg/m  Last Weight:  Wt Readings from Last 1 Encounters:  07/23/17 191 lb (86.6 kg)   RUE:AVWU mass index is 32.79 kg/m.     Last Height:   Ht Readings from Last 1 Encounters:  07/23/17  (1.626 m)    Physical exam:  General: The patient is awake, alert and appears not in acute distress. The patient is well groomed. Head: Normocephalic, atraumatic. Neck is supple. Mallampati 4 with tonsils.  neck circumference:16 . Nasal airflow patent ,  Retrognathia is not seen.  Cardiovascular:  Regular rate and rhythm - without  murmurs or carotid bruit, and without distended neck veins. Respiratory: Lungs are clear to auscultation. Skin:  Without evidence of edema, or rash Trunk: BMI is elevated - 33, truncal obesity -  The patient's posture is stooped.    Neurologic exam : The patient is awake and alert, oriented to place and time.   Memory subjective  described as intact. Attention span & concentration ability appears normal.  Speech is fluent,  without  dysarthria, dysphonia or aphasia.  Mood and affect are appropriate.  Cranial nerves: Pupils are equal and briskly reactive to light. Funduscopic exam complicated by cataract.  Extraocular movements  in vertical and horizontal planes intact and without nystagmus. Visual fields by finger perimetry are intact. Hearing to finger rub impaired- she reads my lips, dysphonia .  Facial sensation intact to fine touch. Facial motor strength is symmetric and tongue and uvula move midline. Shoulder shrug was symmetrical.   Motor exam:   Normal tone, muscle bulk and symmetric strength in all  extremities. Sensory:  Fine touch, pinprick and vibration were tested in all extremities. Proprioception tested in the upper extremities was normal. Coordination: Rapid alternating movements in the fingers/hands was normal. Finger-to-nose maneuver  normal without evidence of ataxia, dysmetria or tremor. Gait and station: Patient walks without assistive device and is able unassisted to climb up to the exam table. Strength within normal limits.  Stance is stable and normal. Toe and heel stand were tested .Tandem gait is unfragmented.  Deep tendon reflexes: in the upper and lower extremities are symmetric and intact.    Assessment:  After physical and neurologic examination, review of laboratory studies,  Personal review of imaging studies, reports of other /same  Imaging studies, results of polysomnography and / or neurophysiology testing and pre-existing records as far as provided in visit., my assessment is   1) Mrs Ferran has a chronically hoarse voice, hearing loss and acid reflex. She has gained weight, but its a very truncal and abdominal distribution. Her diabetes is poorly controlled.   2) Snoring, but not witnessed apnea.  Seems related to REM.   3) obesity.large neck, small air way and poor muscle tone- unable to relax.    The patient was advised of the nature of the diagnosed disorder , the treatment options and the  risks for general health and wellness arising from not treating the condition.   I spent more than 45  minutes of face to face time with the patient.  Greater than 50% of time was spent in counseling and coordination of care. We have discussed the diagnosis and differential and I answered the patient's questions.    Plan:  Treatment plan  and additional workup :  Low carb diet, drink water, hydrate well with water.  HST for apnea evaluation.    Melvyn Novas, MD 07/23/2017, 11:44 AM  Certified in Neurology by ABPN Certified in Sleep Medicine by [facility] [address] [place], [place] [address]

## 2017-08-20 ENCOUNTER — Ambulatory Visit (INDEPENDENT_AMBULATORY_CARE_PROVIDER_SITE_OTHER): Payer: Medicare HMO | Admitting: Neurology

## 2017-08-20 DIAGNOSIS — G4733 Obstructive sleep apnea (adult) (pediatric): Secondary | ICD-10-CM

## 2017-08-20 DIAGNOSIS — R0683 Snoring: Secondary | ICD-10-CM

## 2017-08-20 DIAGNOSIS — R519 Headache, unspecified: Secondary | ICD-10-CM

## 2017-08-20 DIAGNOSIS — R51 Headache: Secondary | ICD-10-CM

## 2017-08-20 DIAGNOSIS — E669 Obesity, unspecified: Secondary | ICD-10-CM

## 2017-08-24 ENCOUNTER — Encounter: Payer: Self-pay | Admitting: Neurology

## 2017-08-24 ENCOUNTER — Telehealth: Payer: Self-pay | Admitting: Neurology

## 2017-08-24 NOTE — Addendum Note (Signed)
Addended by: Melvyn NovasHMEIER, Jacobi Ryant on: 08/24/2017 10:21 AM   Modules accepted: Orders

## 2017-08-24 NOTE — Telephone Encounter (Signed)
I called pt. I advised pt that Dr. Vickey Hugerohmeier reviewed their sleep study results and found that pt has sleep apnea. Dr. Vickey Hugerohmeier recommends that pt starts a auto CPAP. I reviewed PAP compliance expectations with the pt. Pt is agreeable to starting a CPAP. I advised pt that an order will be sent to a DME, [facility], and [facility] will call the pt within about one week after they file with the pt's insurance. [facility] will show the pt how to use the machine, fit for masks, and troubleshoot the CPAP if needed. A follow up appt will need to made for insurance purposes with Dr. Vickey Hugerohmeier or a NP within 31-90 days of starting the machine. The importance of this was discussed. The patient was unable to schedule apt at this time but states the day she gets set up with [facility] she will contact us and schedule the apt. A letter with all of this information in it will be mailed to the pt as a reminder. I verified with the pt that the address we have on file is correct. Pt verbalized understanding of results. Pt had no questions at this time but was encouraged to call back if questions arise.

## 2017-08-24 NOTE — Procedures (Signed)
[facility] [address] [place], [place] [id number] NAME:  Young BerryMargaret A. Caralee West                                                                DOB: 1950-03-23 MEDICAL RECORD [id number]                                                             DOS: 08/20/2017  REFERRING PHYSICIAN: Chilton GreathouseAvva Ravisankar, M.D. STUDY PERFORMED: Home Sleep Study on apnea link HISTORY: Colleen West is a 67 y.o. female patient to be evaluated for sleep apnea in the setting of loud snoring. Chief complaint according to patient: "I wake with a dry mouth and with a dull headache, 80% of all mornings."  Both partners just married 6 month ago, and [name] is very concerned and bothered by her snoring. The patient lived by herself for about 2 decades and even in that time remembers waking up by her own snoring. She is known to snore within 10 minutes into a nap, and at night starts snoring between 2-3 AM. She wakes up with" crazy" dreams, and with dull headaches. One nocturia. Her medical health history includes weight gain, diabetes mellitus, and hypertension, anxiety reaction to stress, hyperlipidemia, acid reflux, and hypothyroidism. She was diagnosed with breast cancer in 2008 and underwent bilateral mastectomies.  Epworth Sleepiness score endorsed at 7/ 24 points. Fatigue severity score at 39/63 points, BMI: 32.7  STUDY RESULTS:  Total Recording Time:  7 hours 43 minutes, valid test time 7h and 22 min. Total Apnea/Hypopnea Index (AHI):  8.8 /h; RDI: 12.1 /h Average Oxygen Saturation: 94 %; Lowest Oxygen Saturation: 77%  Total Time in Oxygen Saturation below 89 %: 48 minutes  Average Heart Rate:  68 bpm (between 56 and 150 bpm) IMPRESSION: This HST revealed only mild sleep apnea, moderate snoring but prolonged hypoxemia. RECOMMENDATION: Treatment option 1) is CPAP therapy as it addresses all 3 findings- apnea (OSA), snoring and low oxygen levels. The headaches and dry mouth could  improve under CPAP with heated humidity. 2) Dental device which mostly addresses snoring.  3) There is always benefit in weight loss.  I certify that I have reviewed the raw data recording prior to the issuance of this report in accordance with the standards of the American Academy of Sleep Medicine (AASM). Melvyn Novasarmen Seferina Brokaw, M.D.   08-23-2017    Medical Director of [facility], accredited by the AASM. Diplomat of the ABPN and ABSM.

## 2017-08-24 NOTE — Telephone Encounter (Signed)
-----   Message from Melvyn Novasarmen Dohmeier, MD sent at 08/24/2017 10:21 AM EDT ----- IMPRESSION: This HST revealed only mild sleep apnea, moderate  snoring but prolonged hypoxemia. RECOMMENDATION: Treatment option 1) is CPAP therapy as it  addresses all 3 findings- apnea (OSA), snoring and low oxygen  levels. The headaches and dry mouth could improve under CPAP with  heated humidity. 2) Dental device which mostly addresses snoring.  3) There is always benefit in weight loss.
